# Patient Record
Sex: Female | Born: 1955 | Race: White | Hispanic: No | Marital: Married | Smoking: Never smoker
Health system: Southern US, Community
[De-identification: ages and names within clinical notes are randomized; demographics above are authoritative.]

## PROBLEM LIST (undated history)

## (undated) DIAGNOSIS — J45909 Unspecified asthma, uncomplicated: Secondary | ICD-10-CM

---

## 1998-05-16 ENCOUNTER — Other Ambulatory Visit: Admission: RE | Admit: 1998-05-16 | Discharge: 1998-05-16 | Payer: Self-pay | Admitting: *Deleted

## 1999-06-27 ENCOUNTER — Ambulatory Visit (HOSPITAL_COMMUNITY): Admission: RE | Admit: 1999-06-27 | Discharge: 1999-06-27 | Payer: Self-pay | Admitting: Surgery

## 1999-07-28 ENCOUNTER — Other Ambulatory Visit: Admission: RE | Admit: 1999-07-28 | Discharge: 1999-07-28 | Payer: Self-pay | Admitting: *Deleted

## 2000-09-24 ENCOUNTER — Other Ambulatory Visit: Admission: RE | Admit: 2000-09-24 | Discharge: 2000-09-24 | Payer: Self-pay | Admitting: *Deleted

## 2001-09-28 ENCOUNTER — Other Ambulatory Visit: Admission: RE | Admit: 2001-09-28 | Discharge: 2001-09-28 | Payer: Self-pay | Admitting: *Deleted

## 2002-11-24 ENCOUNTER — Other Ambulatory Visit: Admission: RE | Admit: 2002-11-24 | Discharge: 2002-11-24 | Payer: Self-pay | Admitting: Obstetrics and Gynecology

## 2005-01-19 ENCOUNTER — Emergency Department (HOSPITAL_COMMUNITY): Admission: EM | Admit: 2005-01-19 | Discharge: 2005-01-19 | Payer: Self-pay | Admitting: Family Medicine

## 2005-01-29 ENCOUNTER — Encounter: Admission: RE | Admit: 2005-01-29 | Discharge: 2005-01-29 | Payer: Self-pay | Admitting: Internal Medicine

## 2006-12-03 ENCOUNTER — Emergency Department (HOSPITAL_COMMUNITY): Admission: EM | Admit: 2006-12-03 | Discharge: 2006-12-03 | Payer: Self-pay | Admitting: Family Medicine

## 2008-09-20 ENCOUNTER — Emergency Department (HOSPITAL_COMMUNITY): Admission: EM | Admit: 2008-09-20 | Discharge: 2008-09-20 | Payer: Self-pay | Admitting: Emergency Medicine

## 2013-12-19 ENCOUNTER — Other Ambulatory Visit: Payer: Self-pay | Admitting: Gastroenterology

## 2013-12-19 DIAGNOSIS — R1310 Dysphagia, unspecified: Secondary | ICD-10-CM

## 2013-12-22 ENCOUNTER — Encounter (INDEPENDENT_AMBULATORY_CARE_PROVIDER_SITE_OTHER): Payer: Self-pay

## 2013-12-22 ENCOUNTER — Ambulatory Visit
Admission: RE | Admit: 2013-12-22 | Discharge: 2013-12-22 | Disposition: A | Discharge status: Home / Self Care | Payer: BC Managed Care – HMO | Source: Ambulatory Visit | Attending: Gastroenterology | Admitting: Gastroenterology

## 2013-12-22 DIAGNOSIS — R1310 Dysphagia, unspecified: Secondary | ICD-10-CM

## 2015-12-26 DIAGNOSIS — I10 Essential (primary) hypertension: Secondary | ICD-10-CM | POA: Diagnosis not present

## 2016-03-18 DIAGNOSIS — L821 Other seborrheic keratosis: Secondary | ICD-10-CM | POA: Diagnosis not present

## 2016-03-18 DIAGNOSIS — L409 Psoriasis, unspecified: Secondary | ICD-10-CM | POA: Diagnosis not present

## 2016-04-24 DIAGNOSIS — Z1231 Encounter for screening mammogram for malignant neoplasm of breast: Secondary | ICD-10-CM | POA: Diagnosis not present

## 2016-05-19 DIAGNOSIS — Z01419 Encounter for gynecological examination (general) (routine) without abnormal findings: Secondary | ICD-10-CM | POA: Diagnosis not present

## 2016-05-19 DIAGNOSIS — Z6829 Body mass index (BMI) 29.0-29.9, adult: Secondary | ICD-10-CM | POA: Diagnosis not present

## 2016-06-24 DIAGNOSIS — J4520 Mild intermittent asthma, uncomplicated: Secondary | ICD-10-CM | POA: Diagnosis not present

## 2016-06-24 DIAGNOSIS — G43909 Migraine, unspecified, not intractable, without status migrainosus: Secondary | ICD-10-CM | POA: Diagnosis not present

## 2016-06-24 DIAGNOSIS — Z0000 Encounter for general adult medical examination without abnormal findings: Secondary | ICD-10-CM | POA: Diagnosis not present

## 2016-06-24 DIAGNOSIS — I10 Essential (primary) hypertension: Secondary | ICD-10-CM | POA: Diagnosis not present

## 2017-06-15 DIAGNOSIS — L821 Other seborrheic keratosis: Secondary | ICD-10-CM | POA: Diagnosis not present

## 2017-06-15 DIAGNOSIS — L409 Psoriasis, unspecified: Secondary | ICD-10-CM | POA: Diagnosis not present

## 2017-06-15 DIAGNOSIS — D225 Melanocytic nevi of trunk: Secondary | ICD-10-CM | POA: Diagnosis not present

## 2017-06-15 DIAGNOSIS — D1800 Hemangioma unspecified site: Secondary | ICD-10-CM | POA: Diagnosis not present

## 2017-07-14 DIAGNOSIS — Z0000 Encounter for general adult medical examination without abnormal findings: Secondary | ICD-10-CM | POA: Diagnosis not present

## 2017-07-14 DIAGNOSIS — Z136 Encounter for screening for cardiovascular disorders: Secondary | ICD-10-CM | POA: Diagnosis not present

## 2017-07-14 DIAGNOSIS — I10 Essential (primary) hypertension: Secondary | ICD-10-CM | POA: Diagnosis not present

## 2017-07-27 DIAGNOSIS — Z1231 Encounter for screening mammogram for malignant neoplasm of breast: Secondary | ICD-10-CM | POA: Diagnosis not present

## 2017-08-19 DIAGNOSIS — Z01419 Encounter for gynecological examination (general) (routine) without abnormal findings: Secondary | ICD-10-CM | POA: Diagnosis not present

## 2017-08-19 DIAGNOSIS — Z6830 Body mass index (BMI) 30.0-30.9, adult: Secondary | ICD-10-CM | POA: Diagnosis not present

## 2017-09-17 ENCOUNTER — Ambulatory Visit (INDEPENDENT_AMBULATORY_CARE_PROVIDER_SITE_OTHER): Payer: BLUE CROSS/BLUE SHIELD

## 2017-09-17 ENCOUNTER — Ambulatory Visit: Payer: BLUE CROSS/BLUE SHIELD | Admitting: Podiatry

## 2017-09-17 ENCOUNTER — Encounter: Payer: Self-pay | Admitting: Podiatry

## 2017-09-17 VITALS — BP 114/65 | HR 79 | Resp 16

## 2017-09-17 DIAGNOSIS — M7751 Other enthesopathy of right foot: Secondary | ICD-10-CM

## 2017-09-17 DIAGNOSIS — M779 Enthesopathy, unspecified: Principal | ICD-10-CM

## 2017-09-17 DIAGNOSIS — M2580 Other specified joint disorders, unspecified joint: Secondary | ICD-10-CM | POA: Diagnosis not present

## 2017-09-17 DIAGNOSIS — M778 Other enthesopathies, not elsewhere classified: Secondary | ICD-10-CM

## 2017-09-17 NOTE — Progress Notes (Signed)
   Subjective:    Patient ID: Natalie Benitez, female    DOB: 01/21/1956, 62 y.o.   MRN: [id number]  HPI Natalie Benitez presents the office today for concerns of pain to the right first MTPJ which is been ongoing for about 6 months.  She describes an aching sensation in the area more in the bottom and the top of the joint.  She states it gets worse with exercise and activity.  She said no treatment.  No recent injury or trauma.  No significant swelling or redness.  No numbness or tingling.  She has no other concerns today.  Review of Systems  HENT: Positive for trouble swallowing.   Respiratory: Positive for apnea.   Skin: Positive for rash.  Neurological: Positive for headaches.  All other systems reviewed and are negative.      Objective:   Physical Exam General: AAO x3, NAD  Dermatological: Skin is warm, dry and supple bilateral. Nails x 10 are well manicured; remaining integument appears unremarkable at this time. There are no open sores, no preulcerative lesions, no rash or signs of infection present.  Vascular: Dorsalis Pedis artery and Posterior Tibial artery pedal pulses are 2/4 bilateral with immedate capillary fill time. There is no pain with calf compression, swelling, warmth, erythema.   Neruologic: Grossly intact via light touch bilateral.  Protective threshold with Semmes Wienstein monofilament intact to all pedal sites bilateral.   Musculoskeletal: Minimal decreased range of motion first MTPJ.  Mild HAV there is plantarflexion of the first ray and there is tenderness of the metatarsal one and on the sesamoids.  No crepitation first MPJ range of motion.  No first ray hypomobility present.  No other area of pinpoint tenderness there is no pain to vibratory sensation.  No significant edema, erythema, increase in warmth.  Muscular strength 5/5 in all groups tested bilateral.  Gait: Unassisted, Nonantalgic.      Assessment & Plan:  62 year old female with right first MTPJ capsulitis,  sesamoiditis -Treatment options discussed including all alternatives, risks, and complications -Etiology of symptoms were discussed -X-rays were obtained and reviewed with the patient.  No evidence of acute fracture.  Accessory sesamoid is present. -Pain is minimal today so we will hold off on steroid injection but we can consider this in the future.  I do think the majority of her tenderness is due to the prominence of the sesamoid complex.  I dispensed offloading pads to help take pressure off this area.  I think long-term she will benefit more from a custom insert to offload submetatarsal 1.  We will check insurance coverage for her.  Anti-inflammatories as needed.  She agrees this plan she has no further questions or concerns.  Vivi BarrackMatthew R Wagoner DPM

## 2017-09-22 ENCOUNTER — Telehealth: Payer: Self-pay | Admitting: Podiatry

## 2017-09-22 NOTE — Telephone Encounter (Signed)
Left voicemail for pt to call me back to discuss orthotic coverage.

## 2017-09-24 NOTE — Telephone Encounter (Signed)
Pt left vm for me to call her back. I returned call and gave pt orthotic coverage information and that she would be responsible for 398.00 and it would go towards deductible. Pt is going to think about it and call back to schedule an appt if she wants to proceed

## 2017-12-20 DIAGNOSIS — Z800 Family history of malignant neoplasm of digestive organs: Secondary | ICD-10-CM | POA: Diagnosis not present

## 2017-12-20 DIAGNOSIS — K210 Gastro-esophageal reflux disease with esophagitis: Secondary | ICD-10-CM | POA: Diagnosis not present

## 2017-12-20 DIAGNOSIS — Z1210 Encounter for screening for malignant neoplasm of intestinal tract, unspecified: Secondary | ICD-10-CM | POA: Diagnosis not present

## 2018-02-08 DIAGNOSIS — R109 Unspecified abdominal pain: Secondary | ICD-10-CM | POA: Diagnosis not present

## 2018-05-18 DIAGNOSIS — Z1211 Encounter for screening for malignant neoplasm of colon: Secondary | ICD-10-CM | POA: Diagnosis not present

## 2018-05-18 DIAGNOSIS — K5730 Diverticulosis of large intestine without perforation or abscess without bleeding: Secondary | ICD-10-CM | POA: Diagnosis not present

## 2018-05-18 DIAGNOSIS — Z800 Family history of malignant neoplasm of digestive organs: Secondary | ICD-10-CM | POA: Diagnosis not present

## 2018-05-18 DIAGNOSIS — K640 First degree hemorrhoids: Secondary | ICD-10-CM | POA: Diagnosis not present

## 2018-06-22 DIAGNOSIS — D225 Melanocytic nevi of trunk: Secondary | ICD-10-CM | POA: Diagnosis not present

## 2018-06-22 DIAGNOSIS — L821 Other seborrheic keratosis: Secondary | ICD-10-CM | POA: Diagnosis not present

## 2018-06-22 DIAGNOSIS — L2084 Intrinsic (allergic) eczema: Secondary | ICD-10-CM | POA: Diagnosis not present

## 2018-06-22 DIAGNOSIS — L814 Other melanin hyperpigmentation: Secondary | ICD-10-CM | POA: Diagnosis not present

## 2018-08-17 ENCOUNTER — Other Ambulatory Visit: Payer: Self-pay

## 2018-08-17 ENCOUNTER — Encounter (HOSPITAL_COMMUNITY): Payer: Self-pay | Admitting: Emergency Medicine

## 2018-08-17 ENCOUNTER — Emergency Department (HOSPITAL_COMMUNITY): Payer: BLUE CROSS/BLUE SHIELD

## 2018-08-17 ENCOUNTER — Emergency Department (HOSPITAL_COMMUNITY)
Admission: EM | Admit: 2018-08-17 | Discharge: 2018-08-17 | Disposition: A | Discharge status: Home / Self Care | Payer: BLUE CROSS/BLUE SHIELD | Attending: Emergency Medicine | Admitting: Emergency Medicine

## 2018-08-17 DIAGNOSIS — Z79899 Other long term (current) drug therapy: Secondary | ICD-10-CM | POA: Insufficient documentation

## 2018-08-17 DIAGNOSIS — R0602 Shortness of breath: Secondary | ICD-10-CM | POA: Diagnosis not present

## 2018-08-17 DIAGNOSIS — J441 Chronic obstructive pulmonary disease with (acute) exacerbation: Secondary | ICD-10-CM | POA: Diagnosis not present

## 2018-08-17 DIAGNOSIS — J4541 Moderate persistent asthma with (acute) exacerbation: Secondary | ICD-10-CM | POA: Insufficient documentation

## 2018-08-17 HISTORY — DX: Unspecified asthma, uncomplicated: J45.909

## 2018-08-17 MED ORDER — IPRATROPIUM-ALBUTEROL 0.5-2.5 (3) MG/3ML IN SOLN
3.0000 mL | Freq: Once | RESPIRATORY_TRACT | Status: AC
  Administered 2018-08-17: 3 mL via RESPIRATORY_TRACT
  Filled 2018-08-17: qty 3

## 2018-08-17 MED ORDER — METHYLPREDNISOLONE SODIUM SUCC 125 MG IJ SOLR
60.0000 mg | Freq: Once | INTRAMUSCULAR | Status: AC
  Administered 2018-08-17: 60 mg via INTRAMUSCULAR
  Filled 2018-08-17: qty 2

## 2018-08-17 MED ORDER — PREDNISONE 10 MG PO TABS: 40.0000 mg | ORAL_TABLET | Freq: Every day | ORAL | 0 refills | Status: AC

## 2018-08-17 NOTE — ED Notes (Signed)
Patient transported to X-ray 

## 2018-08-17 NOTE — ED Notes (Signed)
ED Provider at bedside. 

## 2018-08-17 NOTE — ED Triage Notes (Signed)
Pt states she is having an asthma flare up today. Reports she has been doing extra vacuuming and cleaning at home yesterday. Pt has proair inhaler at home and has used it with little relief.

## 2018-08-17 NOTE — ED Provider Notes (Signed)
[facility]   CSN: [id number] Arrival date & time: 08/17/18  1629     History   Chief Complaint Chief Complaint  Patient presents with  . Asthma    HPI Natalie Benitez is a 62 y.o. female.  The history is provided by the patient. No language interpreter was used.  Asthma    Natalie Benitez is a 62 y.o. female who presents to the Emergency Department complaining of sob. Has a history of asthma and developed increased shortness of breath starting yesterday. This happened when she was doing extra housecleaning. She is been using her power inhaler with no significant change in her symptoms. She reports similar symptoms with asthma flares in the past. She denies any significant chest pain. She has a mild nonproductive cough. No fevers, abdominal pain, vomiting, leg swelling or pain. She does not take hormone medications. Symptoms are moderate to severe, constant, worsening. Past Medical History:  Diagnosis Date  . Asthma     There are no active problems to display for this patient.     OB History   No obstetric history on file.      Home Medications    Prior to Admission medications   Medication Sig Start Date End Date Taking? Authorizing Provider  albuterol (PROVENTIL HFA;VENTOLIN HFA) 108 (90 Base) MCG/ACT inhaler Inhale into the lungs every 6 (six) hours as needed for wheezing or shortness of breath.   Yes [provider]  alclomethasone (ACLOVATE) 0.05 % cream Apply 1 application topically daily as needed (skin irritation).    Yes [provider]  almotriptan (AXERT) 12.5 MG tablet Take 12.5 mg by mouth See admin instructions. Every 2 hours up to twice daily as needed for migraine 09/15/17  Yes [provider]  calcium carbonate (TUMS - DOSED IN MG ELEMENTAL CALCIUM) 500 MG chewable tablet Chew 1-2 tablets by mouth daily.   Yes [provider]  cholecalciferol (VITAMIN D) 25 MCG (1000 UT)  tablet Take 1,000 Units by mouth daily.   Yes [provider]  famotidine-calcium carbonate-magnesium hydroxide (PEPCID COMPLETE) 10-800-165 MG chewable tablet Chew 1 tablet by mouth every evening.   Yes [provider]  FLOVENT HFA 44 MCG/ACT inhaler Inhale 2 puffs into the lungs 2 (two) times daily.  07/19/17  Yes [provider]  fluocinonide (LIDEX) 0.05 % external solution Apply 1 application topically daily as needed (scalp irritation).  06/15/17  Yes [provider]  loratadine (CLARITIN) 10 MG tablet Take 10 mg by mouth daily as needed for allergies.   Yes [provider]  metoprolol succinate (TOPROL-XL) 25 MG 24 hr tablet Take 25 mg by mouth daily. 07/06/17  Yes [provider]  naproxen sodium (ALEVE) 220 MG tablet Take 440 mg by mouth 2 (two) times daily as needed (migraine).   Yes [provider]  nystatin-triamcinolone ointment (MYCOLOG) Apply 1 application topically daily as needed (skin irritation).   Yes [provider]  predniSONE (DELTASONE) 10 MG tablet Take 4 tablets (40 mg total) by mouth daily. 08/17/18   Tilden Fossaees, Janasia Coverdale, MD    Family History No family history on file.  Social History Social History   Tobacco Use  . Smoking status: Never Smoker  . Smokeless tobacco: Never Used  Substance Use Topics  . Alcohol use: No    Frequency: Never  . Drug use: Not on file     Allergies   Pollen extract   Review of Systems Review of  Systems  All other systems reviewed and are negative.    Physical Exam Updated Vital Signs BP 133/71 (BP Location: Right Arm)   Pulse 80   Temp 97.7 F (36.5 C) (Oral)   Resp 20   Ht 5\' 5"  (1.651 m)   Wt 81.6 kg   SpO2 97%   BMI 29.95 kg/m   Physical Exam Vitals signs and nursing note reviewed.  Constitutional:      Appearance: She is well-developed.  HENT:     Head: Normocephalic and atraumatic.  Cardiovascular:     Rate and Rhythm: Normal rate and  regular rhythm.     Heart sounds: No murmur.  Pulmonary:     Comments: Tachypnea. End expiratory wheezes in left sided lung fields, decreased air movement and right-sided lung fields Abdominal:     Palpations: Abdomen is soft.     Tenderness: There is no abdominal tenderness. There is no guarding or rebound.  Musculoskeletal:        General: No tenderness.  Skin:    General: Skin is warm and dry.  Neurological:     Mental Status: She is alert and oriented to person, place, and time.  Psychiatric:        Behavior: Behavior normal.      ED Treatments / Results  Labs (all labs ordered are listed, but only abnormal results are displayed) Labs Reviewed - No data to display  EKG None  Radiology Dg Chest 2 View  Result Date: 08/17/2018 CLINICAL DATA:  Asthma exacerbation. EXAM: CHEST - 2 VIEW COMPARISON:  01/23/2011 FINDINGS: Lungs are adequately inflated without focal airspace consolidation or effusion. Subtle prominence of the central bronchovascular markings. Cardiomediastinal silhouette and remainder the exam is unchanged. IMPRESSION: Subtle prominence of the central bronchovascular markings compatible with patient's asthma exacerbation. No focal pneumonia. Electronically Signed   By: Elberta Fortisaniel  Boyle M.D.   On: 08/17/2018 17:24    Procedures Procedures (including critical care time)  Medications Ordered in ED Medications  methylPREDNISolone sodium succinate (SOLU-MEDROL) 125 mg/2 mL injection 60 mg (60 mg Intramuscular Given 08/17/18 1705)  ipratropium-albuterol (DUONEB) 0.5-2.5 (3) MG/3ML nebulizer solution 3 mL (3 mLs Nebulization Given 08/17/18 1734)     Initial Impression / Assessment and Plan / ED Course  I have reviewed the triage vital signs and the nursing notes.  Pertinent labs & imaging results that were available during my care of the patient were reviewed by me and considered in my medical decision making (see chart for details).     Patient with history of  asthma here for evaluation of increased shortness of breath. On initial assessment she had wheezing and decreased air movement. On repeat assessment after albuterol she is feeling improved with good air movement bilaterally, wheezing resolved. Current presentation is not consistent with PE, pneumonia, CHF. Counseled patient on home care for asthma exacerbation. Discussed outpatient follow-up and return precautions.    Final Clinical Impressions(s) / ED Diagnoses   Final diagnoses:  Moderate persistent asthma with exacerbation    ED Discharge Orders         Ordered    predniSONE (DELTASONE) 10 MG tablet  Daily     08/17/18 1752           Tilden Fossaees, Faigy Stretch, MD 08/17/18 1754

## 2018-08-17 NOTE — ED Notes (Signed)
Patient verbalizes understanding of discharge instructions. Opportunity for questioning and answers were provided. Armband removed by staff, pt discharged from ED ambulatory.   

## 2018-10-26 DIAGNOSIS — M8580 Other specified disorders of bone density and structure, unspecified site: Secondary | ICD-10-CM | POA: Diagnosis not present

## 2018-10-26 DIAGNOSIS — I10 Essential (primary) hypertension: Secondary | ICD-10-CM | POA: Diagnosis not present

## 2018-10-26 DIAGNOSIS — Z0000 Encounter for general adult medical examination without abnormal findings: Secondary | ICD-10-CM | POA: Diagnosis not present

## 2018-10-26 DIAGNOSIS — Z13220 Encounter for screening for lipoid disorders: Secondary | ICD-10-CM | POA: Diagnosis not present

## 2020-07-18 IMAGING — CR DG CHEST 2V
2 series · 2 of 2 positions shown · non-contrast
Comparison: 01/23/2011

CLINICAL DATA: Asthma exacerbation.

EXAM:
CHEST - 2 VIEW

[chest pa]
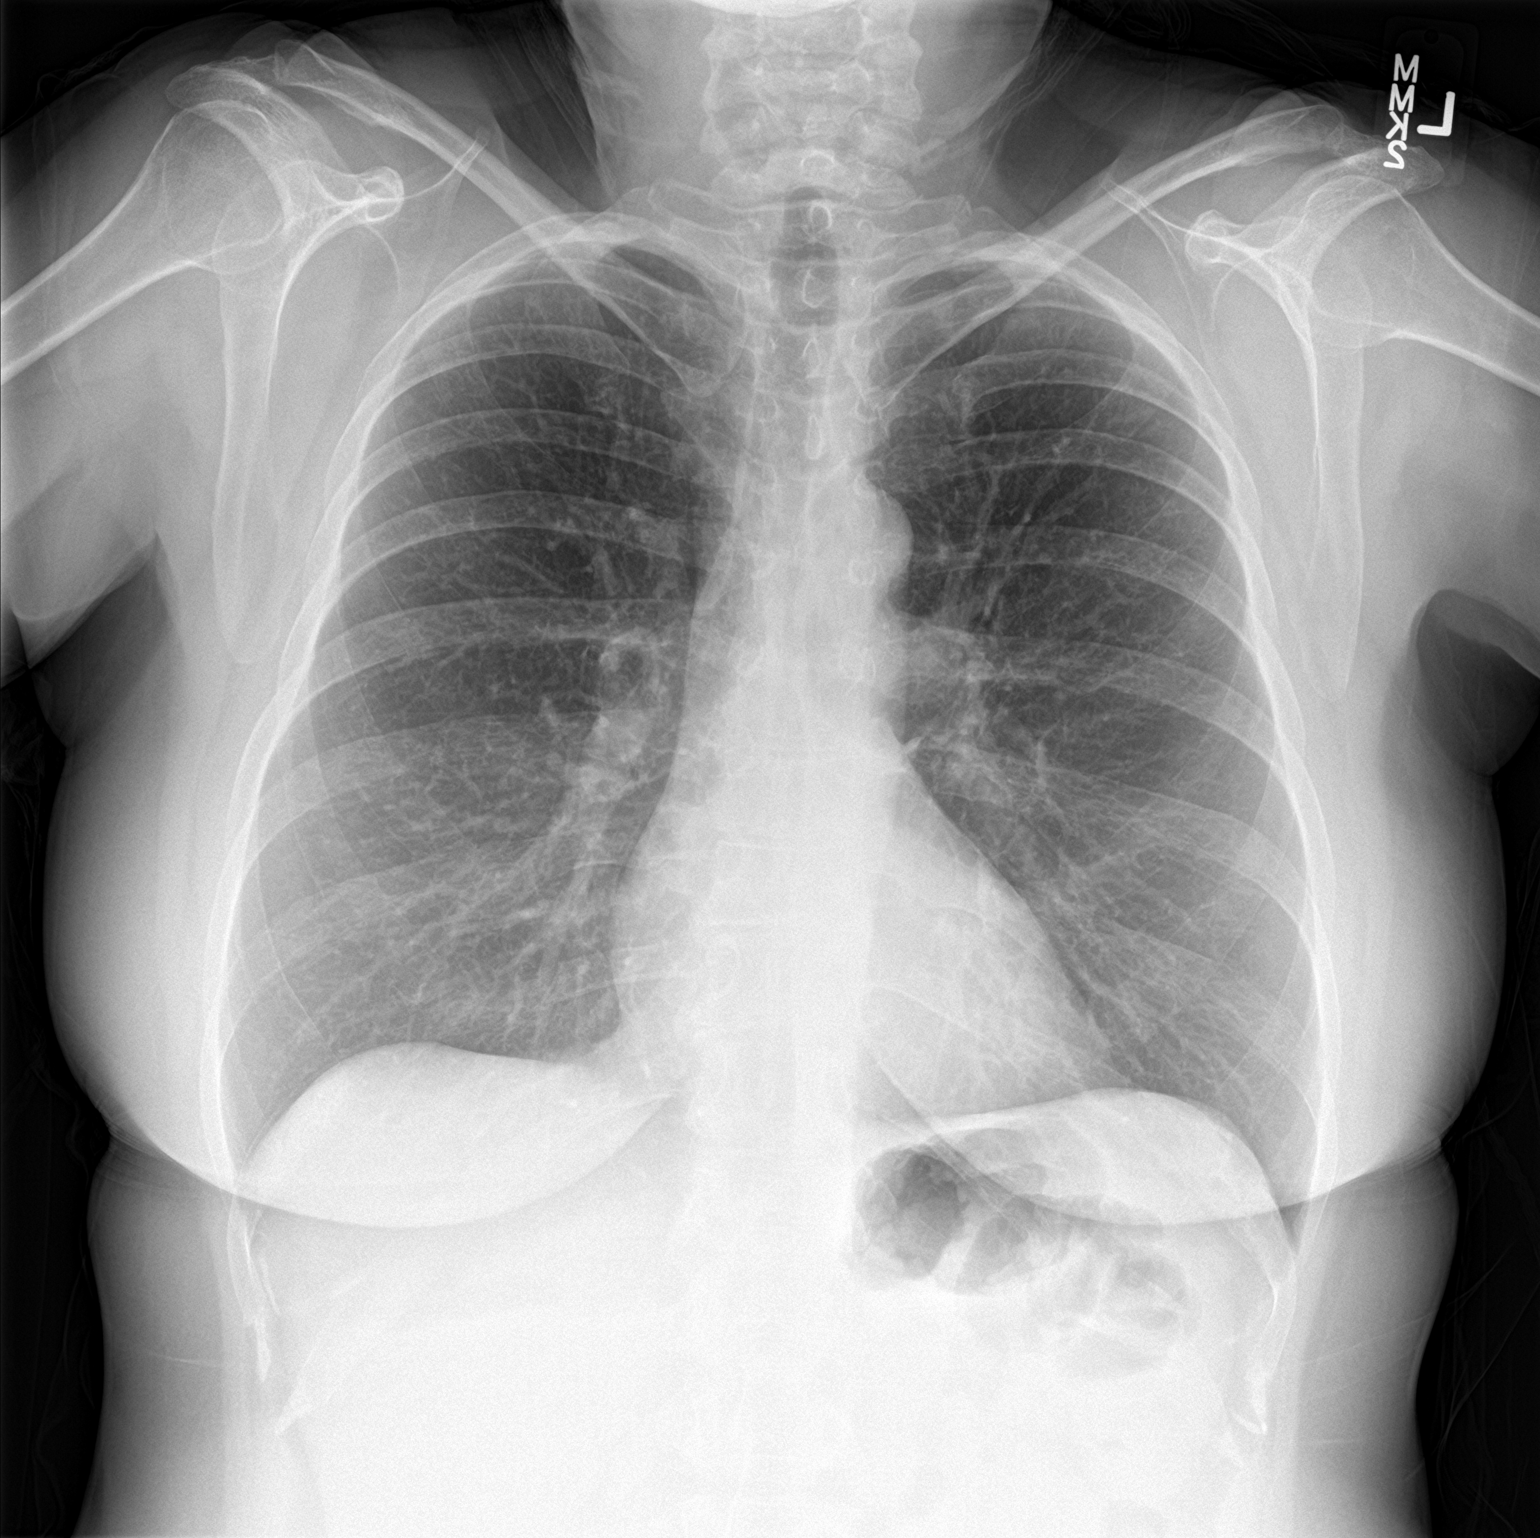

[chest lat]
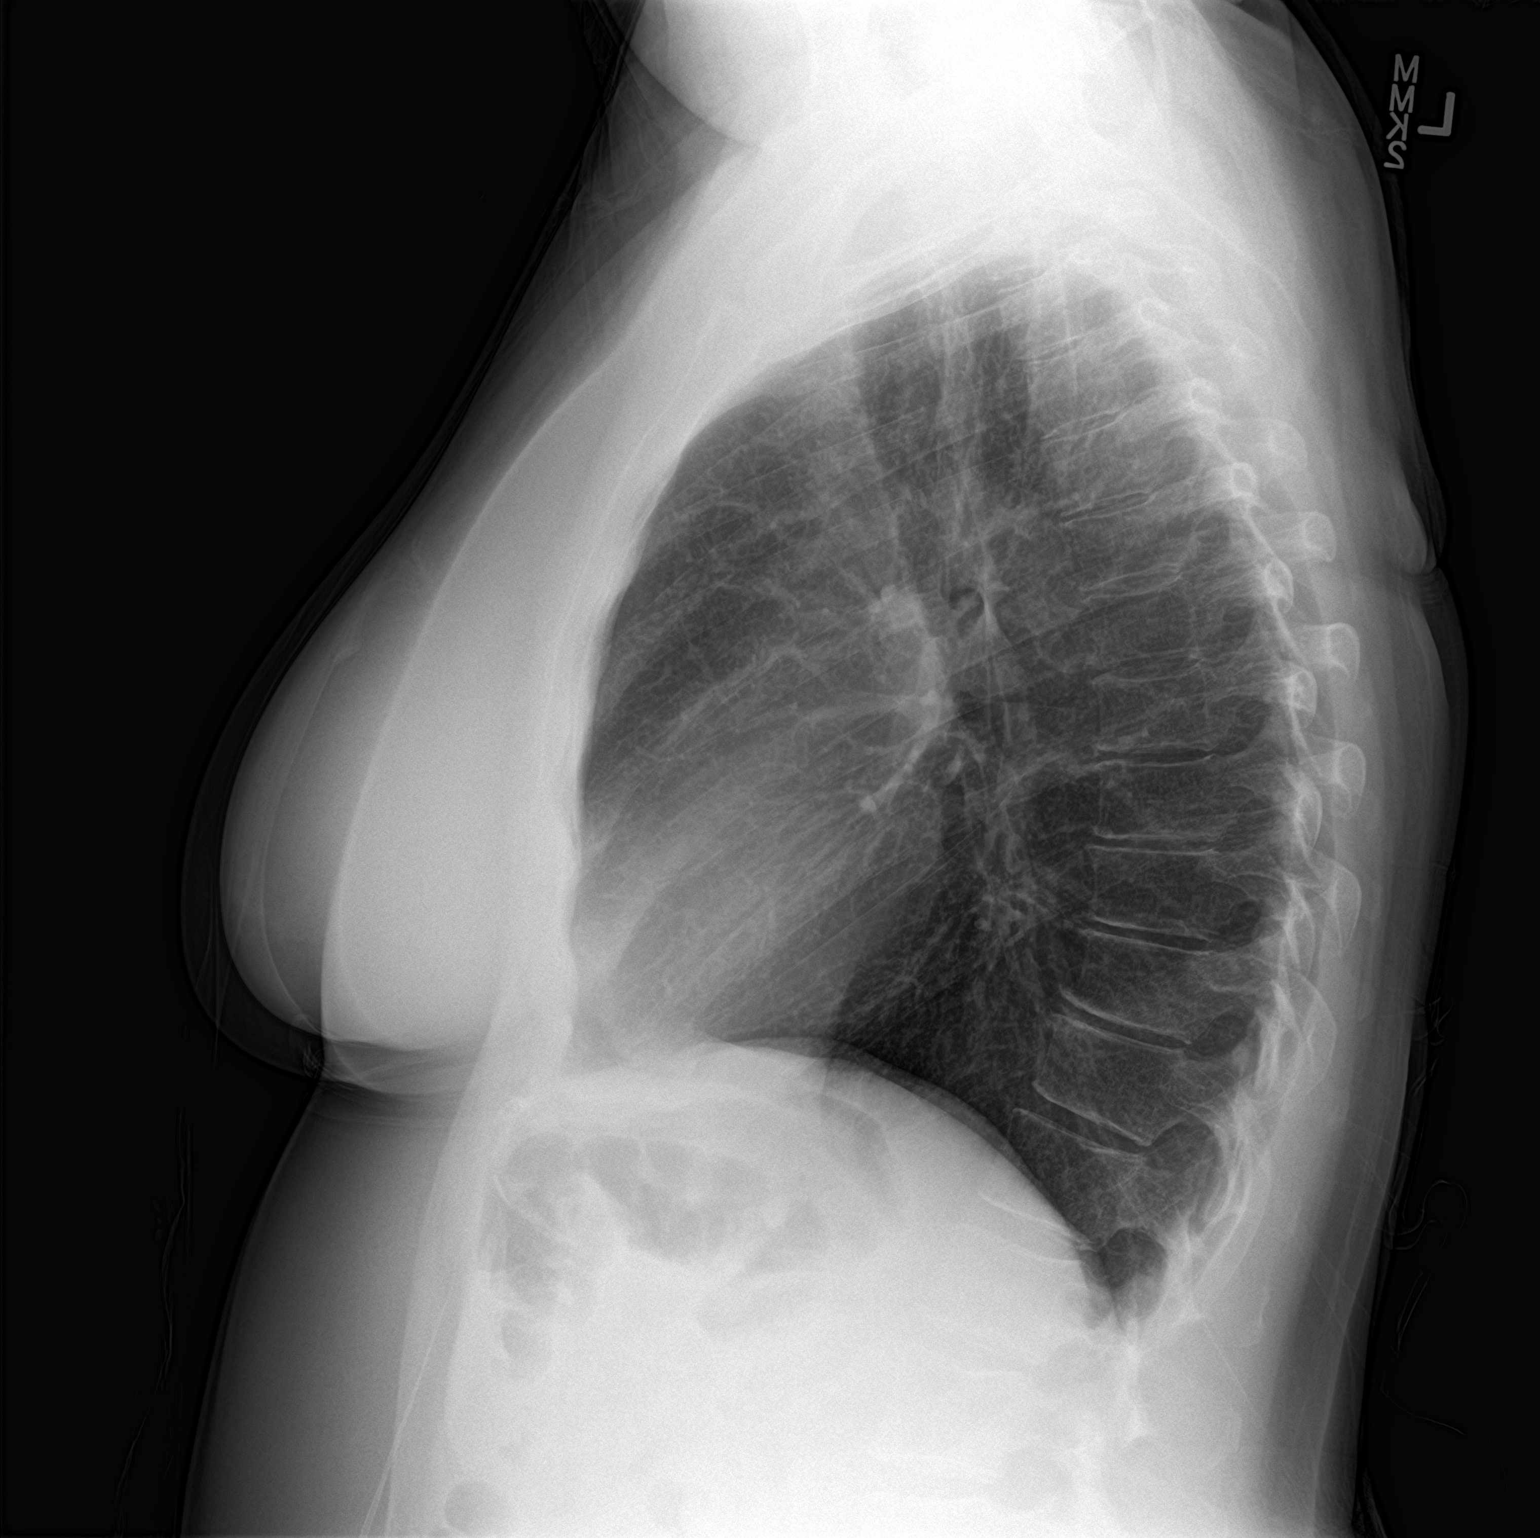

[2 of 2 positions shown; findings below may reference images not displayed]

FINDINGS: Lungs are adequately inflated without focal airspace consolidation
or effusion. Subtle prominence of the central bronchovascular
markings. Cardiomediastinal silhouette and remainder the exam is
unchanged.
IMPRESSION: Subtle prominence of the central bronchovascular markings compatible
with patient's asthma exacerbation. No focal pneumonia.
